# Patient Record
Sex: Male | Born: 1975 | Race: White | Hispanic: No | Marital: Married | Smoking: Current every day smoker
Health system: Southern US, Community
[De-identification: ages and names within clinical notes are randomized; demographics above are authoritative.]

## PROBLEM LIST (undated history)

## (undated) DIAGNOSIS — Z720 Tobacco use: Secondary | ICD-10-CM

## (undated) DIAGNOSIS — R55 Syncope and collapse: Secondary | ICD-10-CM

## (undated) DIAGNOSIS — I493 Ventricular premature depolarization: Secondary | ICD-10-CM

## (undated) DIAGNOSIS — R001 Bradycardia, unspecified: Secondary | ICD-10-CM

## (undated) DIAGNOSIS — I491 Atrial premature depolarization: Secondary | ICD-10-CM

## (undated) HISTORY — DX: Syncope and collapse: R55

## (undated) HISTORY — DX: Ventricular premature depolarization: I49.3

## (undated) HISTORY — DX: Bradycardia, unspecified: R00.1

## (undated) HISTORY — DX: Tobacco use: Z72.0

## (undated) HISTORY — DX: Atrial premature depolarization: I49.1

---

## 2007-04-26 ENCOUNTER — Emergency Department: Payer: Self-pay | Admitting: Emergency Medicine

## 2008-12-12 ENCOUNTER — Ambulatory Visit: Payer: Self-pay | Admitting: Internal Medicine

## 2009-03-31 ENCOUNTER — Emergency Department: Payer: Self-pay | Admitting: Emergency Medicine

## 2009-09-25 ENCOUNTER — Ambulatory Visit: Payer: Self-pay | Admitting: Family Medicine

## 2009-09-29 ENCOUNTER — Emergency Department: Payer: Self-pay | Admitting: Internal Medicine

## 2012-08-14 ENCOUNTER — Observation Stay: Payer: Self-pay | Admitting: Internal Medicine

## 2012-08-14 LAB — DRUG SCREEN, URINE

## 2012-08-14 LAB — URINALYSIS, COMPLETE
Bacteria: NONE SEEN
Bilirubin,UR: NEGATIVE
Blood: NEGATIVE
Glucose,UR: NEGATIVE mg/dL (ref 0–75)
Ketone: NEGATIVE
Leukocyte Esterase: NEGATIVE
Nitrite: NEGATIVE
Ph: 6 (ref 4.5–8.0)
Protein: NEGATIVE
RBC,UR: 1 /HPF (ref 0–5)
Specific Gravity: 1.018 (ref 1.003–1.030)
Squamous Epithelial: <1
WBC UR: 1 /HPF (ref 0–5)

## 2012-08-14 LAB — CK TOTAL AND CKMB (NOT AT ARMC)
CK, Total: 76 U/L (ref 35–232)
CK-MB: 0.9 ng/mL (ref 0.5–3.6)

## 2012-08-14 LAB — CBC
HCT: 45.5 % (ref 40.0–52.0)
MCH: 31.8 pg (ref 26.0–34.0)
MCHC: 35.3 g/dL (ref 32.0–36.0)
MCV: 90 fL (ref 80–100)
Platelet: 220 10*3/uL (ref 150–440)
RDW: 13.1 % (ref 11.5–14.5)

## 2012-08-14 LAB — BASIC METABOLIC PANEL
Anion Gap: 6 — ABNORMAL LOW (ref 7–16)
BUN: 12 mg/dL (ref 7–18)
Calcium, Total: 8.8 mg/dL (ref 8.5–10.1)
Chloride: 110 mmol/L — ABNORMAL HIGH (ref 98–107)
Co2: 23 mmol/L (ref 21–32)
Osmolality: 278 (ref 275–301)

## 2012-08-14 LAB — TROPONIN I: Troponin-I: <0.02 ng/mL

## 2012-08-15 DIAGNOSIS — I498 Other specified cardiac arrhythmias: Secondary | ICD-10-CM

## 2012-08-15 LAB — CK TOTAL AND CKMB (NOT AT ARMC)
CK, Total: 60 U/L (ref 35–232)
CK, Total: 51 U/L (ref 35–232)
CK-MB: 0.9 ng/mL (ref 0.5–3.6)
CK-MB: 0.7 ng/mL (ref 0.5–3.6)

## 2012-08-15 LAB — TROPONIN I
Troponin-I: <0.02 ng/mL
Troponin-I: <0.02 ng/mL

## 2012-08-23 ENCOUNTER — Encounter: Payer: Self-pay | Admitting: Cardiovascular Disease

## 2012-08-30 ENCOUNTER — Ambulatory Visit (INDEPENDENT_AMBULATORY_CARE_PROVIDER_SITE_OTHER): Payer: BC Managed Care – PPO | Admitting: Cardiovascular Disease

## 2012-08-30 ENCOUNTER — Encounter: Payer: Self-pay | Admitting: Cardiovascular Disease

## 2012-08-30 VITALS — BP 110/80 | HR 65 | Ht 76.0 in | Wt 232.0 lb

## 2012-08-30 DIAGNOSIS — I4949 Other premature depolarization: Secondary | ICD-10-CM

## 2012-08-30 DIAGNOSIS — R42 Dizziness and giddiness: Secondary | ICD-10-CM | POA: Insufficient documentation

## 2012-08-30 DIAGNOSIS — R079 Chest pain, unspecified: Secondary | ICD-10-CM

## 2012-08-30 DIAGNOSIS — I493 Ventricular premature depolarization: Secondary | ICD-10-CM | POA: Insufficient documentation

## 2012-08-30 MED ORDER — FLUDROCORTISONE ACETATE 0.1 MG PO TABS: 0.1000 mg | ORAL_TABLET | Freq: Every day | ORAL | Status: AC

## 2012-08-30 NOTE — Progress Notes (Signed)
   Patient ID: Micheal Sanchez, male    DOB: 08-Dec-1975, 36 y.o.   MRN: [id number]  HPI Comments: Mr. Freilich is a 36 year old gentleman who works at [employer]  with a long history of bradycardia, ectopy including PVCs and APCs, tobacco abuse who was admitted to the hospital 08/15/2012 for left-sided chest pain, tingling to the left arm. Cardiology was consult for bradycardia and arrhythmia.  Notes indicate that in July 2013 he had symptoms of bradycardia, chest pain. At [facility] he had echocardiogram and stress test that were normal. He was seen by [facility] and loop monitor was placed for 30 days showing recurrent PVCs. He was started on metoprolol.  On his recent admission, heart rates were down into the 40s. Blood pressure was adequate and stable. Cardiac enzymes negative. He continues to smoke one pack per day.  Symptoms consultation were various including chest discomfort, malaise, lightheadedness. Reports that he has had a long history of dizziness. Sometimes very short episodes, other times longer episodes for which she has to sit down. He attributes these episodes to either a low heart rate or blood pressure problems. He has not been taking it about her since his discharge and he has not noticed much difference. He does notice palpitations more without beta blocker but no improvement of his lightheadedness.  EKG today shows normal sinus rhythm with rate 65 beats per minute with no significant ST or T wave changes no ectopy   Outpatient Encounter Prescriptions as of 08/30/2012  Medication Sig Dispense Refill  . metoprolol tartrate (LOPRESSOR) 25 MG tablet Take 12.5 mg by mouth daily as needed.        Review of Systems  Constitutional: Negative.   HENT: Negative.   Eyes: Negative.   Respiratory: Negative.   Cardiovascular: Positive for chest pain.  Gastrointestinal: Negative.   Musculoskeletal: Negative.   Skin: Negative.   Neurological: Positive for light-headedness.  Hematological:  Negative.   Psychiatric/Behavioral: Negative.   All other systems reviewed and are negative.    BP 110/80  Pulse 65  Ht 6\' 4"  (1.93 m)  Wt 232 lb (105.235 kg)  BMI 28.24 kg/m2  Physical Exam  Nursing note and vitals reviewed. Constitutional: He is oriented to person, place, and time. He appears well-developed and well-nourished.  HENT:  Head: Normocephalic.  Nose: Nose normal.  Mouth/Throat: Oropharynx is clear and moist.  Eyes: Conjunctivae normal are normal. Pupils are equal, round, and reactive to light.  Neck: Normal range of motion. Neck supple. No JVD present.  Cardiovascular: Normal rate, regular rhythm, S1 normal, S2 normal, normal heart sounds and intact distal pulses.  Exam reveals no gallop and no friction rub.   No murmur heard. Pulmonary/Chest: Effort normal and breath sounds normal. No respiratory distress. He has no wheezes. He has no rales. He exhibits no tenderness.  Abdominal: Soft. Bowel sounds are normal. He exhibits no distension. There is no tenderness.  Musculoskeletal: Normal range of motion. He exhibits no edema and no tenderness.  Lymphadenopathy:    He has no cervical adenopathy.  Neurological: He is alert and oriented to person, place, and time. Coordination normal.  Skin: Skin is warm and dry. No rash noted. No erythema.  Psychiatric: He has a normal mood and affect. His behavior is normal. Judgment and thought content normal.           Assessment and Plan

## 2012-08-30 NOTE — Assessment & Plan Note (Signed)
Atypical chest pain. Prior stress test. Additional workup could include chest CT scan, possibly cardiac CT.

## 2012-08-30 NOTE — Assessment & Plan Note (Signed)
Etiology of his lightheadedness is unclear. Blood pressure is borderline low at times. Uncertain if his lightheadedness is from periodic bradycardia as he has had previously. We'll try to obtain the previous loop monitor. At that time beta blockers were prescribed. He seems to be relatively asymptomatic from his PVCs and more symptomatic from lightheadedness and chest pain. The metoprolol was recently held and heart rate is currently improved.  He does have a possible if he is having drops in his blood pressure, possibly associated with bradycardia. Most of his symptoms seem to be when he is walking at work or upright. Rare episodes when sitting. We have suggested we try to tackle one problem at a time. Rate seems improved without metoprolol that he continues to have symptoms. We will try to work on his blood pressure and start Florinef 0.1 mg daily with close monitoring of his blood pressure. If symptoms improve, this would argue that he is has labile blood pressure or vasovagal episodes. If no improvement, we will stop the Florinef.

## 2012-08-30 NOTE — Assessment & Plan Note (Signed)
Relatively asymptomatic from his PVCs apart from palpitations. Given bradycardia, we will hold the metoprolol and suggest he take this as needed.

## 2012-08-30 NOTE — Patient Instructions (Addendum)
  Please start florinef one pill a day for dizziness Monitor your blood pressure and call the office if it runs high on a regular basis (we would decrease the dose of the pill)  Please call us if you have new issues that need to be addressed before your next appt.  Your physician wants you to follow-up in: 1 month.

## 2012-09-07 ENCOUNTER — Telehealth: Payer: Self-pay

## 2012-09-07 NOTE — Telephone Encounter (Signed)
Pt called to report how he is feeling on new med/Florinef since Dr. Mariah Milling prescribed last week.  He wants [place] to know BP has been good 120/80 but he still has episodes of dizziness/lightheadedness. Says today he has not been able to go to work d/t his symptoms.  He is unsure as to what his BP is at this moment.  He denies any other associated symptoms such as CP, SOB.  I explained I would inform Dr. Mariah Milling of this and will call pt back with his response. Understanding verb.

## 2012-09-10 NOTE — Telephone Encounter (Signed)
I suspect his symptoms may not be cardiac. We can try to obtain his 30 day monitor from [facility] to verify their findings or he can repeat a 30 day monitor with [place] if still having sx.   He may need neurology eval if not cardiac. Would not miss work. Needs PMD to discuss symptoms.

## 2012-09-11 ENCOUNTER — Other Ambulatory Visit: Payer: Self-pay

## 2012-09-11 DIAGNOSIS — R42 Dizziness and giddiness: Secondary | ICD-10-CM

## 2012-09-11 NOTE — Telephone Encounter (Signed)
Event monitor ordered via e cardio

## 2012-09-11 NOTE — Telephone Encounter (Signed)
I called to make appt for neuro at [facility] with Dr. Malvin Johns. The receptionist tells me pt needs to contact financial counseling office at [phone] and pt can schedule own appt at that time. Pt was informed Understanding verb.

## 2012-09-11 NOTE — Telephone Encounter (Signed)
Pt informed Says he is still feeling poorly, having some dizziness at work Says BP at times is 150/99 while HR=70-76 BPM. He says other times, BP is normal and HR is "low" I explained Dr. Mariah Milling does not feel his symptoms are cardiac related but offered another 30 day monitor (per Dr. Mariah Milling) and a neuro referral Pt wants both of these. I told him I would go ahead and order monitor and refer to neuro I will call him back at [phone], [phone]

## 2012-09-13 DIAGNOSIS — R42 Dizziness and giddiness: Secondary | ICD-10-CM

## 2012-10-04 ENCOUNTER — Encounter: Payer: Self-pay | Admitting: Cardiovascular Disease

## 2012-10-04 ENCOUNTER — Ambulatory Visit (INDEPENDENT_AMBULATORY_CARE_PROVIDER_SITE_OTHER): Payer: BC Managed Care – PPO | Admitting: Cardiovascular Disease

## 2012-10-04 VITALS — BP 122/72 | HR 56 | Ht 76.0 in | Wt 232.8 lb

## 2012-10-04 DIAGNOSIS — I493 Ventricular premature depolarization: Secondary | ICD-10-CM

## 2012-10-04 DIAGNOSIS — R42 Dizziness and giddiness: Secondary | ICD-10-CM

## 2012-10-04 DIAGNOSIS — R079 Chest pain, unspecified: Secondary | ICD-10-CM

## 2012-10-04 DIAGNOSIS — R001 Bradycardia, unspecified: Secondary | ICD-10-CM

## 2012-10-04 DIAGNOSIS — R0602 Shortness of breath: Secondary | ICD-10-CM

## 2012-10-04 DIAGNOSIS — I4949 Other premature depolarization: Secondary | ICD-10-CM

## 2012-10-04 DIAGNOSIS — I498 Other specified cardiac arrhythmias: Secondary | ICD-10-CM

## 2012-10-04 NOTE — Assessment & Plan Note (Signed)
Chest pain is atypical in nature. Recent echocardiogram and stress test at [facility] showing no ischemia.

## 2012-10-04 NOTE — Assessment & Plan Note (Signed)
It would seem that he has asymptomatic bradycardia. Heart rate typically runs in the high 40s to 60s on a regular basis. We held the metoprolol as to not exaggerate any bradycardia and this was started at [facility] for PVCs but he is relatively asymptomatic from his ectopy.

## 2012-10-04 NOTE — Assessment & Plan Note (Addendum)
Etiology of his lightheadedness episodes is still uncertain. He continues to have these episodes despite a trial of Florinef. No significant events on monitor that would contribute to his lightheadedness. He has not marked events in the past 30 days to track his dizziness to underlying arrhythmia. No further cardiac testing at this time. We did discuss possibly having him evaluated by neurology.

## 2012-10-04 NOTE — Patient Instructions (Addendum)
You are doing well. No medication changes were made.  Please call us if you have new issues that need to be addressed before your next appt.    

## 2012-10-04 NOTE — Assessment & Plan Note (Signed)
Frequent PVCs on 30 day monitor. Asymptomatic in general. We did discuss antiarrhythmics though as he is asymptomatic, these will be started.

## 2012-10-04 NOTE — Progress Notes (Signed)
Patient ID: Micheal Sanchez, male    DOB: [birth year], 37 y.o.   MRN: [id number]  HPI Comments: Micheal Sanchez is a 37 year old gentleman who works at [employer]  with a long history of bradycardia, ectopy including PVCs and APCs, tobacco abuse who was admitted to the hospital 08/15/2012 for left-sided chest pain, tingling to the left arm. Cardiology was consult for bradycardia and arrhythmia.  Notes indicate that in July 2013 he had symptoms of bradycardia, chest pain. At [facility] he had echocardiogram and stress test that were normal. He was seen by [facility] and loop monitor was placed for 30 days showing recurrent PVCs. He was started on metoprolol.  On his recent admission, heart rates were down into the 40s. Blood pressure was adequate and stable. Cardiac enzymes negative. He continues to smoke one pack per day.  On his last clinic visit, blood pressure was borderline low and there was concern of orthostatic hypotension causing his dizziness. He was started on Florinef with no improvement of his dizziness. Blood pressure seemed to climb and he stopped the medication when he measured systolic pressures in the 160 range. Again no improvement in his dizziness.   He is almost done wearing his 30 day monitor. Review of the details so far show periods of bradycardia with rates in the 50s, rarely 40s, frequent PVCs. No other arrhythmia noted. He reports having tachycardia using an application on his smart phone but this is not correlated on the 30 day monitor.   He also reports having headaches, periods of abdominal cramping, periods of lightheadedness, dizziness, general malaise, fatigue, chest pain  EKG today shows normal sinus rhythm with rate 56 beats per minute with PVCs noted   Outpatient Encounter Prescriptions as of 10/04/2012  Medication Sig Dispense Refill  . metoprolol tartrate (LOPRESSOR) 25 MG tablet Take 12.5 mg by mouth daily as needed.         Review of Systems  Constitutional: Negative.    HENT: Negative.   Eyes: Negative.   Respiratory: Positive for shortness of breath.   Cardiovascular: Positive for chest pain and palpitations.  Gastrointestinal: Negative.   Musculoskeletal: Negative.   Skin: Negative.   Neurological: Positive for dizziness.  Hematological: Negative.   Psychiatric/Behavioral: Negative.   All other systems reviewed and are negative.    BP 122/72  Pulse 56  Ht 6\' 4"  (1.93 m)  Wt 232 lb 12 oz (105.575 kg)  BMI 28.33 kg/m2  Physical Exam  Nursing note and vitals reviewed. Constitutional: He is oriented to person, place, and time. He appears well-developed and well-nourished.  HENT:  Head: Normocephalic.  Nose: Nose normal.  Mouth/Throat: Oropharynx is clear and moist.  Eyes: Conjunctivae normal are normal. Pupils are equal, round, and reactive to light.  Neck: Normal range of motion. Neck supple. No JVD present.  Cardiovascular: Normal rate, regular rhythm, S1 normal, S2 normal, normal heart sounds and intact distal pulses.  Exam reveals no gallop and no friction rub.   No murmur heard. Pulmonary/Chest: Effort normal and breath sounds normal. No respiratory distress. He has no wheezes. He has no rales. He exhibits no tenderness.  Abdominal: Soft. Bowel sounds are normal. He exhibits no distension. There is no tenderness.  Musculoskeletal: Normal range of motion. He exhibits no edema and no tenderness.  Lymphadenopathy:    He has no cervical adenopathy.  Neurological: He is alert and oriented to person, place, and time. Coordination normal.  Skin: Skin is warm and dry. No rash noted. No  erythema.  Psychiatric: He has a normal mood and affect. His behavior is normal. Judgment and thought content normal.           Assessment and Plan

## 2012-10-19 ENCOUNTER — Other Ambulatory Visit: Payer: Self-pay | Admitting: *Deleted

## 2012-10-19 ENCOUNTER — Other Ambulatory Visit: Payer: Self-pay

## 2012-10-19 DIAGNOSIS — R42 Dizziness and giddiness: Secondary | ICD-10-CM

## 2012-10-19 DIAGNOSIS — I493 Ventricular premature depolarization: Secondary | ICD-10-CM

## 2012-11-06 ENCOUNTER — Observation Stay: Payer: Self-pay | Admitting: Internal Medicine

## 2012-11-06 LAB — COMPREHENSIVE METABOLIC PANEL
Albumin: 3.9 g/dL (ref 3.4–5.0)
Alkaline Phosphatase: 105 U/L (ref 50–136)
Anion Gap: 4 — ABNORMAL LOW (ref 7–16)
BUN: 12 mg/dL (ref 7–18)
Bilirubin,Total: 0.5 mg/dL (ref 0.2–1.0)
Calcium, Total: 9 mg/dL (ref 8.5–10.1)
Chloride: 108 mmol/L — ABNORMAL HIGH (ref 98–107)
Co2: 29 mmol/L (ref 21–32)
Creatinine: 1.04 mg/dL (ref 0.60–1.30)
Potassium: 3.9 mmol/L (ref 3.5–5.1)
SGOT(AST): 21 U/L (ref 15–37)
Sodium: 141 mmol/L (ref 136–145)
Total Protein: 7.1 g/dL (ref 6.4–8.2)

## 2012-11-06 LAB — URINALYSIS, COMPLETE
Bilirubin,UR: NEGATIVE
Blood: NEGATIVE
Leukocyte Esterase: NEGATIVE
Nitrite: NEGATIVE
Ph: 6 (ref 4.5–8.0)
Protein: NEGATIVE
RBC,UR: <1 /HPF (ref 0–5)
Specific Gravity: 1.02 (ref 1.003–1.030)
Squamous Epithelial: <1
WBC UR: 4 /HPF (ref 0–5)

## 2012-11-06 LAB — CBC
HCT: 46.2 % (ref 40.0–52.0)
HGB: 15.8 g/dL (ref 13.0–18.0)
MCH: 30.9 pg (ref 26.0–34.0)
Platelet: 236 10*3/uL (ref 150–440)
RBC: 5.11 10*6/uL (ref 4.40–5.90)
RDW: 13.4 % (ref 11.5–14.5)

## 2012-11-06 LAB — LIPASE, BLOOD: Lipase: 74 U/L (ref 73–393)

## 2012-11-07 LAB — CBC WITH DIFFERENTIAL/PLATELET
Eosinophil #: 0.4 10*3/uL (ref 0.0–0.7)
Eosinophil %: 6.6 %
HCT: 42.3 % (ref 40.0–52.0)
Lymphocyte #: 2.8 10*3/uL (ref 1.0–3.6)
MCH: 31.2 pg (ref 26.0–34.0)
MCHC: 34.7 g/dL (ref 32.0–36.0)
MCV: 90 fL (ref 80–100)
Monocyte #: 0.6 x10 3/mm (ref 0.2–1.0)
Platelet: 190 10*3/uL (ref 150–440)
RBC: 4.69 10*6/uL (ref 4.40–5.90)

## 2012-12-29 ENCOUNTER — Other Ambulatory Visit: Payer: Self-pay | Admitting: Unknown Physician Specialty

## 2012-12-29 ENCOUNTER — Ambulatory Visit: Payer: Self-pay | Admitting: Unknown Physician Specialty

## 2012-12-31 LAB — WBCS, STOOL

## 2013-01-01 ENCOUNTER — Emergency Department: Payer: Self-pay | Admitting: Emergency Medicine

## 2013-01-01 LAB — COMPREHENSIVE METABOLIC PANEL
Anion Gap: 9 (ref 7–16)
Bilirubin,Total: 0.4 mg/dL (ref 0.2–1.0)
Chloride: 108 mmol/L — ABNORMAL HIGH (ref 98–107)
EGFR (African American): >60
EGFR (Non-African Amer.): >60
Glucose: 96 mg/dL (ref 65–99)
Osmolality: 276 (ref 275–301)
SGOT(AST): 25 U/L (ref 15–37)
Sodium: 139 mmol/L (ref 136–145)
Total Protein: 7.4 g/dL (ref 6.4–8.2)

## 2013-01-01 LAB — URINALYSIS, COMPLETE
Bacteria: NONE SEEN
Bilirubin,UR: NEGATIVE
Blood: NEGATIVE
Glucose,UR: NEGATIVE mg/dL (ref 0–75)
Leukocyte Esterase: NEGATIVE
Nitrite: NEGATIVE
Ph: 5 (ref 4.5–8.0)
RBC,UR: 1 /HPF (ref 0–5)
Specific Gravity: 1.03 (ref 1.003–1.030)
Squamous Epithelial: <1
WBC UR: <1 /HPF (ref 0–5)

## 2013-01-01 LAB — CBC
HGB: 16.6 g/dL (ref 13.0–18.0)
MCHC: 34.4 g/dL (ref 32.0–36.0)
MCV: 89 fL (ref 80–100)
Platelet: 253 10*3/uL (ref 150–440)
WBC: 8.1 10*3/uL (ref 3.8–10.6)

## 2013-01-03 ENCOUNTER — Ambulatory Visit: Payer: Self-pay | Admitting: Unknown Physician Specialty

## 2013-01-05 LAB — PATHOLOGY REPORT

## 2013-01-17 ENCOUNTER — Other Ambulatory Visit: Payer: Self-pay | Admitting: Unknown Physician Specialty

## 2013-01-17 DIAGNOSIS — R1032 Left lower quadrant pain: Secondary | ICD-10-CM

## 2013-01-18 ENCOUNTER — Ambulatory Visit
Admission: RE | Admit: 2013-01-18 | Discharge: 2013-01-18 | Disposition: A | Discharge status: Home / Self Care | Payer: BC Managed Care – PPO | Source: Ambulatory Visit | Attending: Unknown Physician Specialty | Admitting: Unknown Physician Specialty

## 2013-01-18 DIAGNOSIS — R1032 Left lower quadrant pain: Secondary | ICD-10-CM

## 2013-01-18 MED ORDER — IOHEXOL 300 MG/ML  SOLN
125.0000 mL | Freq: Once | INTRAMUSCULAR | Status: AC | PRN
  Administered 2013-01-18: 125 mL via INTRAVENOUS

## 2013-07-26 ENCOUNTER — Emergency Department: Payer: Self-pay | Admitting: Emergency Medicine

## 2013-07-26 LAB — COMPREHENSIVE METABOLIC PANEL
Albumin: 4 g/dL (ref 3.4–5.0)
Alkaline Phosphatase: 100 U/L (ref 50–136)
BUN: 9 mg/dL (ref 7–18)
Bilirubin,Total: 0.6 mg/dL (ref 0.2–1.0)
Calcium, Total: 9 mg/dL (ref 8.5–10.1)
Co2: 23 mmol/L (ref 21–32)
Creatinine: 1.22 mg/dL (ref 0.60–1.30)
EGFR (African American): >60
EGFR (Non-African Amer.): >60
Glucose: 109 mg/dL — ABNORMAL HIGH (ref 65–99)
Total Protein: 7.2 g/dL (ref 6.4–8.2)

## 2013-07-26 LAB — URINALYSIS, COMPLETE
Bilirubin,UR: NEGATIVE
Blood: NEGATIVE
Ketone: NEGATIVE
Nitrite: NEGATIVE
Ph: 7 (ref 4.5–8.0)
Protein: 30
RBC,UR: 1 /HPF (ref 0–5)
Specific Gravity: 1.027 (ref 1.003–1.030)
Squamous Epithelial: NONE SEEN

## 2013-07-26 LAB — CBC
HGB: 17.2 g/dL (ref 13.0–18.0)
MCHC: 35.1 g/dL (ref 32.0–36.0)
RDW: 13.2 % (ref 11.5–14.5)

## 2013-08-06 ENCOUNTER — Emergency Department: Payer: Self-pay | Admitting: Emergency Medicine

## 2014-10-16 IMAGING — CT CT ABD-PELV W/ CM
1 of 2 series · 15 of 32 positions shown, 19 images · non-contrast
Comparison: none

REASON FOR EXAM: Abd Pain Diarrhea
COMMENTS:

[Series 2: abd 3mm w 3.0 i40f 3 · axial · 0.79mm/px · z∈[-1010,-568]mm · 15 of 161 slices shown, 19 images]
[im 7/161  soft-tissue]
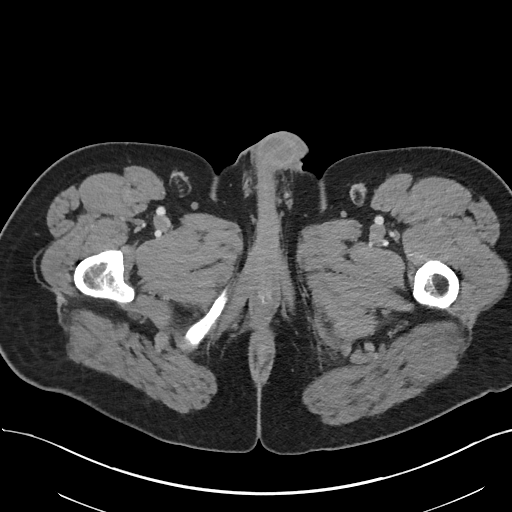
[im 7/161  bone]
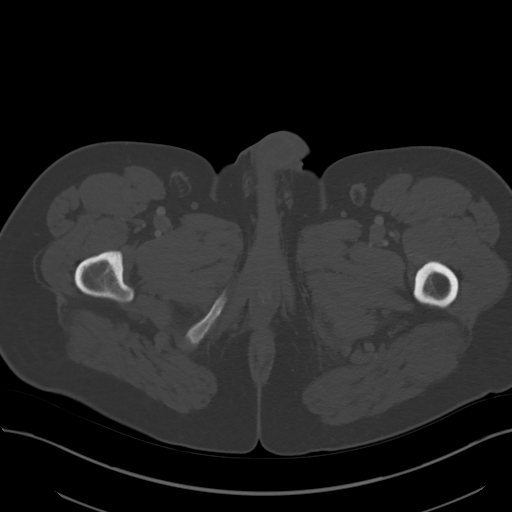
[im 21/161  soft-tissue]
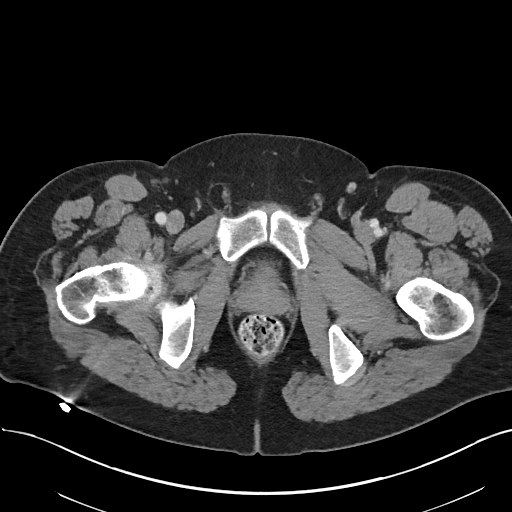
[im 34/161  soft-tissue]
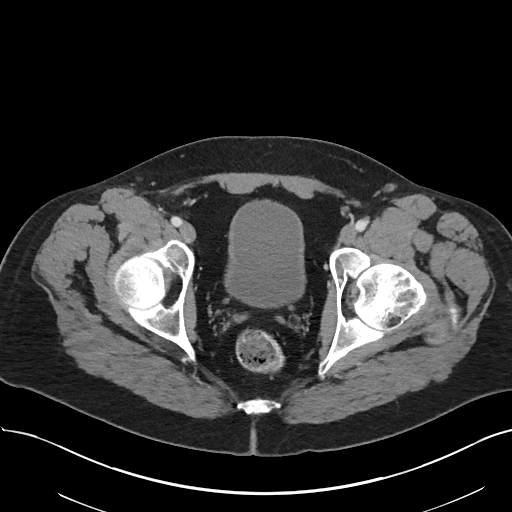
[im 47/161  soft-tissue]
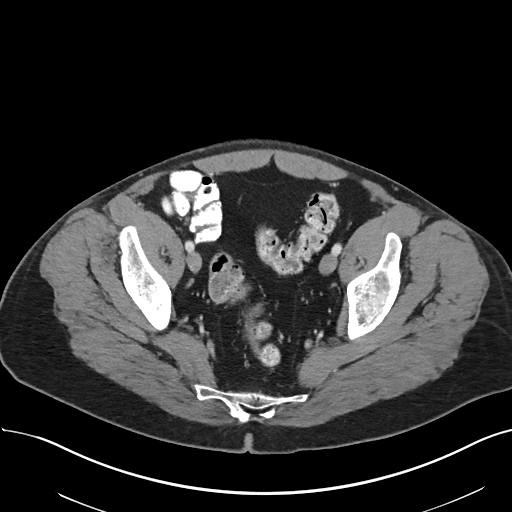
[im 54/161  soft-tissue]
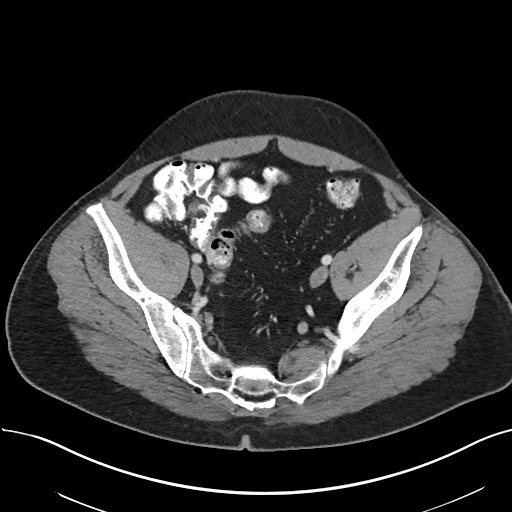
[im 67/161  soft-tissue]
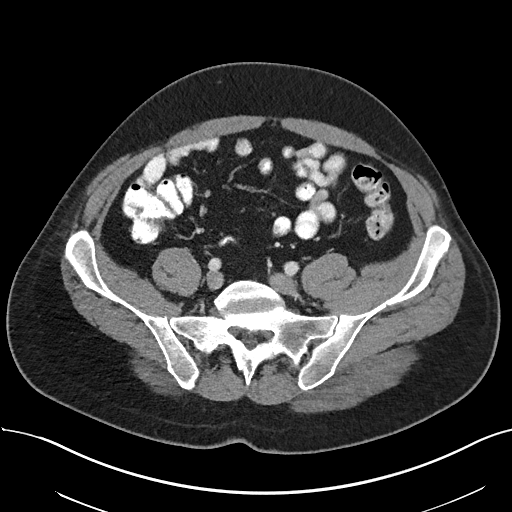
[im 81/161  soft-tissue]
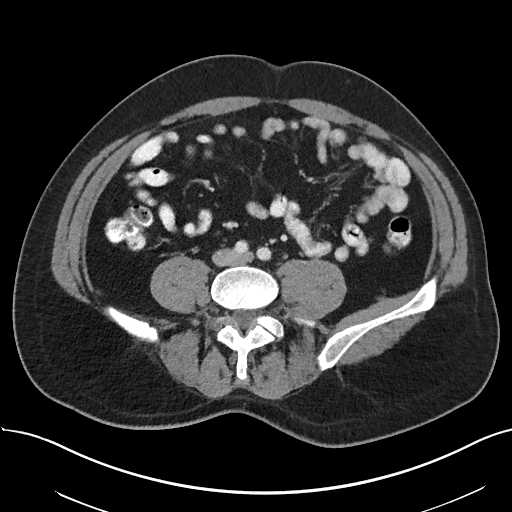
[im 94/161  soft-tissue]
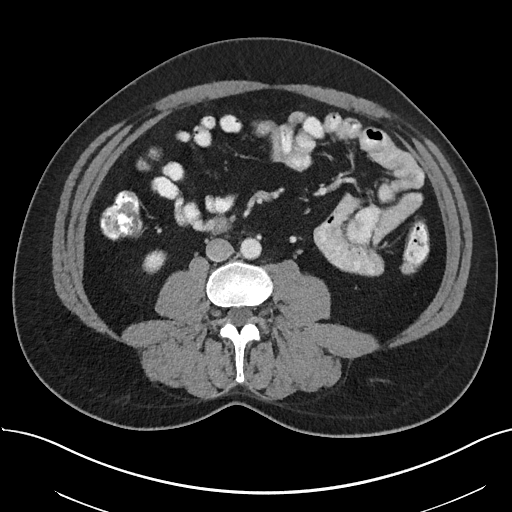
[im 107/161  soft-tissue]
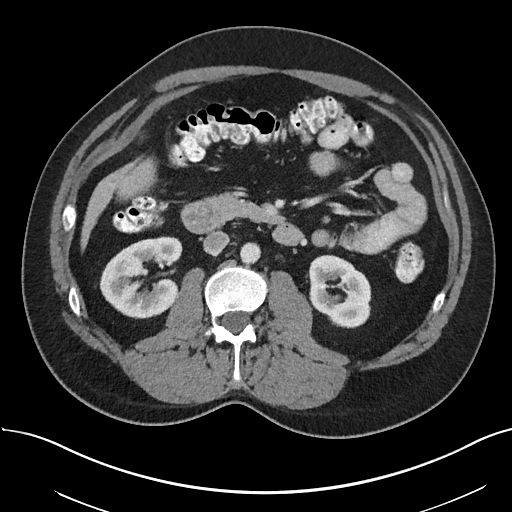
[im 107/161  bone]
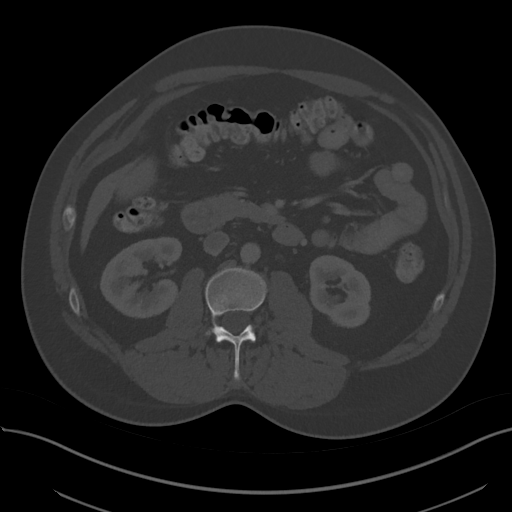
[im 114/161  soft-tissue]
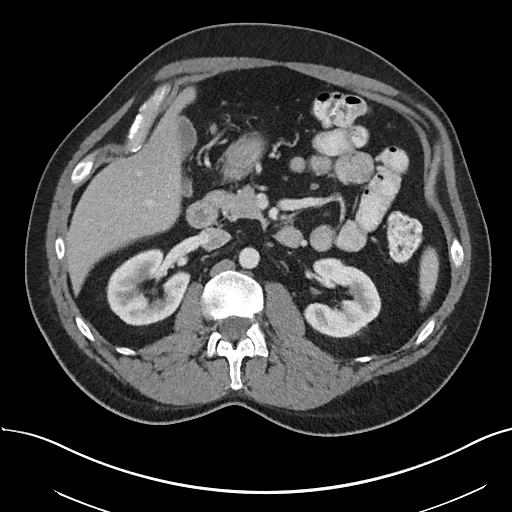
[im 127/161  soft-tissue]
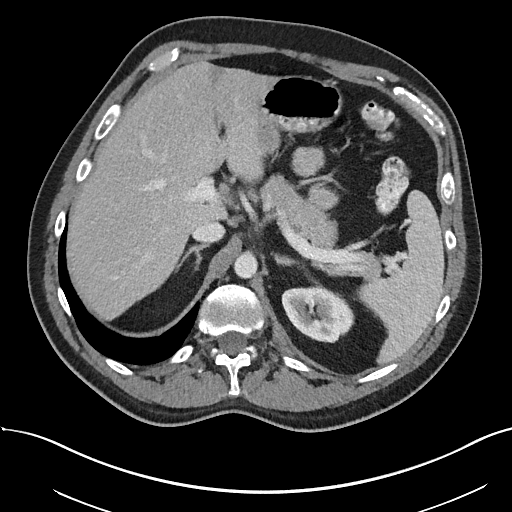
[im 134/161  lung]
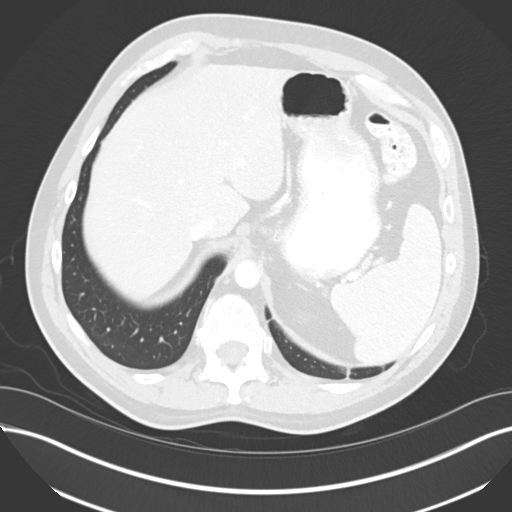
[im 141/161  soft-tissue]
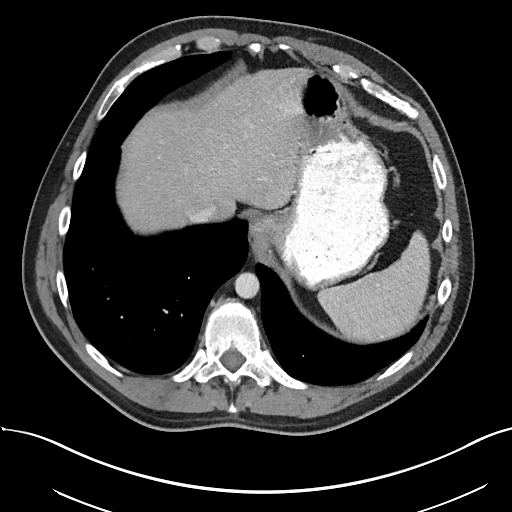
[im 141/161  lung]
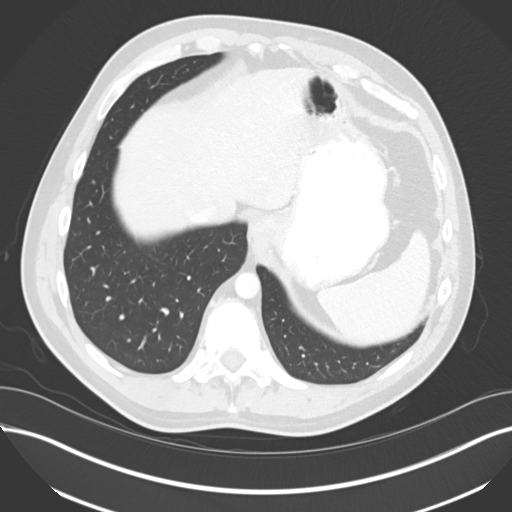
[im 147/161  lung]
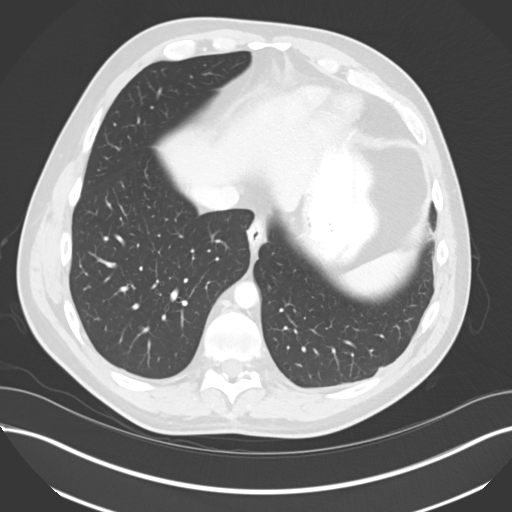
[im 154/161  soft-tissue]
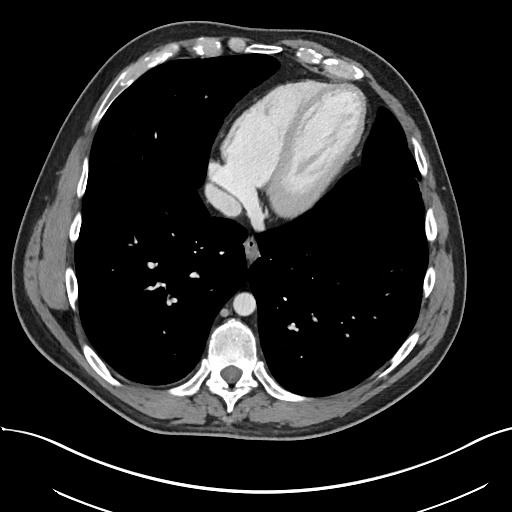
[im 154/161  lung]
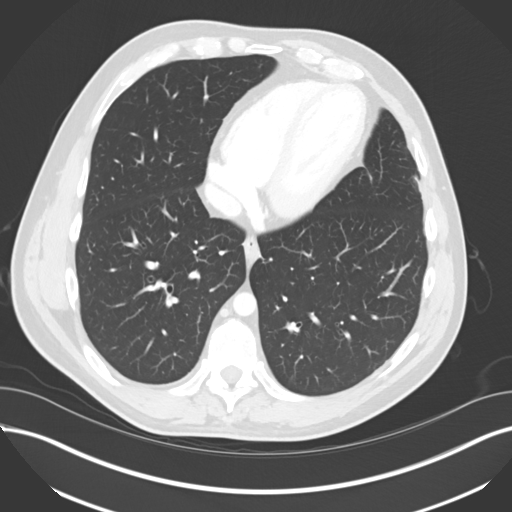

[15 of 32 positions shown; findings below may reference images not displayed]

PROCEDURE:     KCT - KCT ABDOMEN/PELVIS W  - December 29, 2012  [DATE]

RESULT:     Axial CT scanning was performed through the abdomen and pelvis
with reconstructions at 3 mm intervals and slice thicknesses. The patient
received 100 cc of [id number] as well as oral contrast material.

The stomach is moderately distended with the orally administered contrast.
There is subjective thickening of the wall of the pyloric region of the
stomach. Contrast has traversed the pylorus and reached as far distally as
the rectosigmoid but the volume of contrast in the upper abdomen is fairly
limited. The duodenum is normal in caliber. I do not see perigastric
inflammatory changes or lymphadenopathy.

The liver exhibits no focal mass or ductal dilation. The gallbladder is
adequately distended. The pancreas, spleen, adrenal glands, and kidneys are
normal in appearance. The caliber of the abdominal aorta is normal.

The partially contrast-filled loops of small and large bowel exhibit no
evidence of ileus or obstruction. A normal calibered uninflamed appendix is
demonstrated. The urinary bladder, prostate gland, and seminal vesicles are
normal in appearance.

The lumbar vertebral bodies are preserved in height. The lung bases are
clear.
IMPRESSION: 1. There is subjective thickening of the wall of the pyloric region of the
stomach. The stomach is moderately distended with contrast. Direct
visualization of the stomach and proximal duodenum would be useful.
2. There is no evidence of acute abnormality of the liver or gallbladder or
pancreas. There is no splenomegaly.
3. There is no intra-abdominal or pelvic lymphadenopathy.
4. There is no acute abnormality of the urinary tracts nor of the small or
large bowel.

[REDACTED]

## 2015-01-14 NOTE — H&P (Signed)
PATIENT NAME:  Micheal Sanchez, Micheal Sanchez MR#:  [id number] DATE OF BIRTH:  11-27-1975  DATE OF ADMISSION:  08/14/2012  PRIMARY CARDIOLOGIST: [facility]    CHIEF COMPLAINT: Chest tightness and lightheadedness.   HISTORY OF PRESENT ILLNESS: This is a 39 year old Caucasian male patient with history of bradycardia and tobacco abuse on metoprolol who presents to the Emergency Room complaining of left-sided chest tightness radiating along with tingling in his left arm. No aggravating or relieving factors. This is not exertional. No associated PND, orthopnea, shortness of breath. The patient has had on and off lightheadedness to the point that he almost blacks out and feeling extremely weak. The patient in July of 2013 had similar chest pain along with bradycardia who [facility] where he had an echo and stress test which were normal. He was on a loop monitor for 30 days, was diagnosed with recurrent PVCs and was placed on metoprolol. He does have good and bad days. Today in the Emergency Room the patient was found to be bradycardic in the 40's. While I was in the room talking to the patient, he did go as low as 38 per minute and the patient is being admitted for sinus bradycardia with PVCs associated with chest pain.   The patient does not have hypertension or diabetes. He does smoke a pack a day of cigarettes. No premature coronary artery disease in the family. Cardiac enzymes here in the ER are normal. EKG shows occasional PVCs. No acute ST-T wave changes.   PAST MEDICAL HISTORY:  1. Bradycardia. 2. Tobacco abuse.    FAMILY HISTORY: History of heart attack in his uncle and grandfather. No premature coronary artery disease.   SOCIAL HISTORY: The patient smokes a pack a day. Occasional alcohol. No illicit drugs. He works for [employer].  REVIEW OF SYSTEMS: CONSTITUTIONAL: Complains of fatigue and weakness. No weight loss, weight gain. EYES: No blurred vision, discharge, or pain. ENT: No  tinnitus, hearing loss, or sinusitis. CARDIOVASCULAR: Complains of chest pain radiating to the left arm and has bradycardia. No PND or orthopnea. RESPIRATORY: No shortness of breath, wheezing, hemoptysis. GI: No nausea, vomiting, diarrhea, melena. GENITOURINARY: No dysuria, frequency, hematuria. SKIN: No rash, ulcers. MUSCULOSKELETAL: No arthritis, gout. NEUROLOGIC: No focal numbness, weakness, dysarthria. ENDOCRINE: No thyroid problems or diabetes. No polyuria or polydipsia. HEMATOLOGIC: No easy bruising, bleeding, or anemia.   HOME MEDICATIONS: Metoprolol 25 mg half a tablet oral twice a day.   ALLERGIES: No known drug allergies.   PHYSICAL EXAMINATION:   VITAL SIGNS: Temperature 98.1, pulse ranging between 38 to 52, respirations 20, blood pressure 118/71, saturating 97% on room air.   GENERAL: Moderately built Caucasian male patient lying in bed comfortable, conversational, cooperative with exam.   PSYCHIATRIC: Alert, oriented x3. Mood and affect appropriate. Judgment intact.   HEENT: Atraumatic, normocephalic. Oral mucosa moist and pink. External ears and nose normal. No pallor. No icterus. Pupils bilaterally equal and reactive to light.   NECK: Supple. No thyromegaly. No palpable lymph nodes. Trachea midline. No carotid bruit or JVD.   CARDIOVASCULAR: S1 and S2 bradycardia without any murmurs. Peripheral pulses 2+. No edema.   RESPIRATORY: Normal work of breathing. Clear to auscultation on both sides. Normal to percussion.   GI: Soft abdomen, nontender. Bowel sounds present. No hepatosplenomegaly palpable.   SKIN: Warm and dry. No petechiae, rash, ulcers.   MUSCULOSKELETAL: No joint swelling, redness, effusion of the large joints. Normal muscle tone.   NEUROLOGICAL: Motor strength 5/5 in  upper and lower extremities. Sensation to fine touch intact all over.   LYMPHATIC: No cervical lymphadenopathy.   LABORATORY, DIAGNOSTIC AND RADIOLOGIC DATA: Glucose 105, BUN 12, creatinine 0.84,  sodium 134, potassium 4.1, GFR greater than 60. TSH 0.714. Troponin less than 0.02. CK 76. MB 0.9. Urine drug screen negative. WBC 7.1, hemoglobin 16. Urinalysis shows no WBC or bacteria.   EKG shows normal sinus rhythm with occasional PVCs, bradycardia to 48.   ASSESSMENT AND PLAN:  1. Sinus bradycardia likely secondary from the metoprolol the patient is on. The patient has symptomatic bradycardia with presyncopal episodes and feeling extremely fatigued. He did go down as low as 38 while I was in the room on the tele. Will admit the patient for monitoring. Stop the metoprolol. TSH is normal.  2. Atypical chest pain in a patient with no risk factors who is young and had a recent stress test which has been negative. Extremely unlikely to be acute coronary syndrome. Will check two more sets of cardiac enzymes. Will consult Cardiology for input with the bradycardia. Also, the patient requests to be established with a local cardiologist in town.  3. Tobacco abuse. I have counseled the patient for more than three minutes to quit smoking.  4. DVT prophylaxis with ambulation.   CODE STATUS: FULL CODE.   TIME SPENT: Time spent today on this case was 45 minutes with more than 50% time spent in coordination of care.    ____________________________ Molinda Bailiff. Jadore Mcguffin, MD srs:drc D: 08/14/2012 18:44:39 ET T: 08/15/2012 05:41:32 ET JOB#: [id number]  cc: Wardell Heath R. Elpidio Anis, MD, <Dictator> Antonieta Iba, MD Orie Fisherman MD ELECTRONICALLY SIGNED 08/15/2012 16:51

## 2015-01-14 NOTE — Discharge Summary (Signed)
PATIENT NAME:  Micheal Sanchez, Micheal Sanchez MR#:  [id number] DATE OF BIRTH:  01/22/1976  DATE OF ADMISSION:  08/14/2012 DATE OF DISCHARGE:  08/15/2012  DISCHARGE DIAGNOSES:  1. Sinus bradycardia, likely from beta blocker, now stopped. Can be used as needed for palpitations only. 2. Chest pain, likely noncardiac.   SECONDARY DIAGNOSES:  1. Bradycardia. 2. Tobacco abuse.   CONSULTANTS: Julien Nordmannimothy Gollan, MD - Cardiology.  PROCEDURES/RADIOLOGY: Chest x-ray on 08/14/2012 showed no acute cardiopulmonary disease.   MAJOR LABORATORY PANEL: Urinalysis on admission was negative.   HISTORY AND SHORT HOSPITAL COURSE: The patient is a 39 year old male with known history of bradycardia who was admitted for sinus bradycardia thought to be secondary to beta blocker. He did not have much symptoms other than some weakness and possible presyncopal episode. Cardiology consultation was obtained with Dr. Mariah MillingGollan and Myoview was done and had negative three sets of cardiac enzymes, also had normal TSH and urine drug screen was also negative. As the patient was not symptomatic, cardiology recommended no further work-up and holding metoprolol. The patient did have some PVCs and APCs on telemetry, but he was asymptomatic. Dr. Mariah MillingGollan recommended discharge. The patient was in agreement and went home in stable condition.   DISCHARGE PHYSICAL EXAMINATION:   VITAL SIGNS: On the date of discharge, on 08/15/2012, his temperature was 97.5, heart rate 43 per minute, respirations 16 per minute, blood pressure 124/79 mmHg, and he was saturating 96% on room air.   CARDIOVASCULAR: S1 and S2 normal. No murmurs, rubs, or gallops.   LUNGS: Clear to auscultation bilaterally. No wheezing, rales, rhonchi, or crepitation.   ABDOMEN: Soft, benign.   NEUROLOGIC: Nonfocal examination.   All other physical examination remained at baseline.   DISCHARGE MEDICATIONS: Metoprolol 12.5 mg p.o. at bedtime as needed for palpitations only.  DISCHARGE DIET:  Regular.   DISCHARGE ACTIVITY: As tolerated.   DISCHARGE INSTRUCTIONS AND FOLLOW-UP: The patient was instructed to follow-up with [facility] or [facility] in 1 to 2 weeks.   TOTAL TIME DISCHARGING THIS PATIENT: 45 minutes. ____________________________ Ellamae SiaVipul S. Sherryll BurgerShah, MD vss:slb D: 08/19/2012 12:17:41 ET T: 08/20/2012 14:25:19 ET JOB#: [id number]  cc: Micheal Mendibles S. Sherryll BurgerShah, MD, <Dictator> Micheal Sanchez. Gollan, MD [facility] Ellamae SiaVIPUL S [facility] MD ELECTRONICALLY SIGNED 08/20/2012 14:39

## 2015-01-14 NOTE — Consult Note (Signed)
General Aspect 39 year old Caucasian male patient with history of bradycardia, tobacco abuse, on metoprolol for PVCs seen on event monitor, who presents to the Emergency Room complaining of left-sided chest tightness radiating along with tingling in his left arm. Cardiology was consulted for chest pain sx, bradycardia, arrhythmia.  Chest presented yesterday, while at rest. No aggravating or relieving factors. No associated PND, orthopnea, shortness of breath. he reports rare episodes of lightheadedness with feelings of extreme weakness. Sx can last all day sometimes and he does not go to work. The patient in July of 2013 had similar chest pain along with bradycardia who presented to [facility] where he had an echo and stress test which were normal. He was seen by [facility] and loop monitor for 30 days ordered, showing  recurrent PVCs and was placed on metoprolol.  In the Emergency Room the patient was found to be bradycardic in the 40???s, BP stable.  cardiac enz negative x 3  The patient does not have hypertension or diabetes.  Social:  He does smoke a pack a day of cigarettes.Libves with Wife.   Family Hx:  No premature coronary artery disease in the family.   Physical Exam:   GEN well developed, well nourished, no acute distress    HEENT red conjunctivae    NECK supple  No masses    RESP normal resp effort  clear BS    CARD Regular rate and rhythm  No murmur    ABD denies tenderness  soft    EXTR negative edema    SKIN normal to palpation    NEURO motor/sensory function intact    PSYCH alert, A+O to time, place, person, good insight   Review of Systems:   Subjective/Chief Complaint Chest pain    General: Fatigue    Skin: No Complaints    ENT: No Complaints    Eyes: No Complaints    Neck: No Complaints    Respiratory: No Complaints    Cardiovascular: No Complaints    Gastrointestinal: No Complaints    Genitourinary: No Complaints    Vascular: No  Complaints    Musculoskeletal: No Complaints    Neurologic: No Complaints    Hematologic: No Complaints    Endocrine: No Complaints    Psychiatric: No Complaints    Review of Systems: All other systems were reviewed and found to be negative    Medications/Allergies Reviewed Medications/Allergies reviewed        Admit Diagnosis:   CHEST PAIN: 15-Aug-2012, Active, CHEST PAIN  Home Medications: Medication Instructions Status  metoprolol tartrate 25 mg oral tablet 0.5 tab(s) orally 2 times a day Active   Lab Results:  Thyroid:  18-Nov-13 16:14    Thyroid Stimulating Hormone 0.714 (0.45-4.50 (International Unit)  ----------------------- Pregnant patients have  different reference  ranges for TSH:  - - - - - - - - - -  Pregnant, first trimetser:  0.36 - 2.50 uIU/mL)  Routine Chem:  18-Nov-13 16:14    Glucose, Serum  105   BUN 12   Creatinine (comp) 0.84   Sodium, Serum 139   Potassium, Serum 4.1   Chloride, Serum  110   CO2, Serum 23   Calcium (Total), Serum 8.8   Anion Gap  6   Osmolality (calc) 278   eGFR (African American) >60   eGFR (Non-African American) >60 (eGFR values <72m/min/1.73 m2 may be an indication of chronic kidney disease (CKD). Calculated eGFR is useful in patients with stable renal  function. The eGFR calculation will not be reliable in acutely ill patients when serum creatinine is changing rapidly. It is not useful in  patients on dialysis. The eGFR calculation may not be applicable to patients at the low and high extremes of body sizes, pregnant women, and vegetarians.)  Cardiac:  18-Nov-13 16:14    CK, Total 76   CPK-MB, Serum 0.9 (Result(s) reported on 14 Aug 2012 at 04:57PM.)   Troponin I < 0.02 (0.00-0.05 0.05 ng/mL or less: NEGATIVE  Repeat testing in 3-6 hrs  if clinically indicated. >0.05 ng/mL: POTENTIAL  MYOCARDIAL INJURY. Repeat  testing in 3-6 hrs if  clinically indicated. NOTE: An increase or decrease  of 30% or more on  serial  testing suggests a  clinically important change)  19-Nov-13 00:25    CK, Total 60   CPK-MB, Serum 0.9 (Result(s) reported on 15 Aug 2012 at 01:06AM.)   Troponin I < 0.02 (0.00-0.05 0.05 ng/mL or less: NEGATIVE  Repeat testing in 3-6 hrs  if clinically indicated. >0.05 ng/mL: POTENTIAL  MYOCARDIAL INJURY. Repeat  testing in 3-6 hrs if  clinically indicated. NOTE: An increase or decrease  of 30% or more on serial  testing suggests a  clinically important change)  Routine Coag:  18-Nov-13 16:14    Activated PTT (APTT) 29.8 (A HCT value >55% may artifactually increase the APTT. In one study, the increase was an average of 19%. Reference: "Effect on Routine and Special Coagulation Testing Values of Citrate Anticoagulant Adjustment in Patients with High HCT Values." American Journal of Clinical Pathology 2006;126:400-405.)   Prothrombin 13.9   INR 1.0 (INR reference interval applies to patients on anticoagulant therapy. A single INR therapeutic range for coumarins is not optimal for all indications; however, the suggested range for most indications is 2.0 - 3.0. Exceptions to the INR Reference Range may include: Prosthetic heart valves, acute myocardial infarction, prevention of myocardial infarction, and combinations of aspirin and anticoagulant. The need for a higher or lower target INR must be assessed individually. Reference: The Pharmacology and Management of the Vitamin K  antagonists: the seventh ACCP Conference on Antithrombotic and Thrombolytic Therapy. GUYQI.3474 Sept:126 (3suppl): [id number]. A HCT value >55% may artifactually increase the PT.  In one study,  the increase was an average of 25%. Reference:  "Effect on Routine and Special Coagulation Testing Values of Citrate Anticoagulant Adjustment in Patients with High HCT Values." American Journal of Clinical Pathology 2006;126:400-405.)  Routine Hem:  18-Nov-13 16:14    WBC (CBC) 7.1   RBC (CBC) 5.04    Hemoglobin (CBC) 16.0   Hematocrit (CBC) 45.5   Platelet Count (CBC) 220 (Result(s) reported on 14 Aug 2012 at 04:24PM.)   MCV 90   MCH 31.8   MCHC 35.3   RDW 13.1   EKG:   Interpretation EKG with sinus bradycardia, rate 54 bpm, rare PVCs    No Known Allergies:   Vital Signs/Nurse's Notes: **Vital Signs.:   19-Nov-13 15:59   Vital Signs Type Routine   Temperature Temperature (F) 97.5   Celsius 36.3   Temperature Source Oral   Pulse Pulse 43   Respirations Respirations 16   Systolic BP Systolic BP 259   Diastolic BP (mmHg) Diastolic BP (mmHg) 79   Mean BP 94   Pulse Ox % Pulse Ox % 96   Pulse Ox Activity Level  At rest   Oxygen Delivery Room Air/ 21 %     Impression 39 year old Caucasian male patient with history of bradycardia, tobacco  abuse, on metoprolol for PVCs seen on event monitor, who presents to the Emergency Room complaining of left-sided chest tightness radiating along with tingling in his left arm.    1) Bradycardia:  hemodynamically stable despite epsiodes of bradycardia. No further workup needed Previous EP work up at [facility] metoprolol given fatigue  2) PVCs, APCS on tele asymptomatic.  Hold metoprolol as this is not causing his chest pain and he has no sx of palpitations. If sx get worse and felt to be secondary to PVCs, could consider flecainide 25 mg po BID with slow titration upwards.  3) Chest pain: Negative cardiac enz x 3 previous stress test and echo at [facility] No further workup needed. Possible pericarditis, musculoskeletal, cervical spine? Consider a trial of NSAIDS  Ok to D/C with follow up   Electronic Signatures: Ida Rogue (MD)  (Signed [phone] 17:24)  Authored: General Aspect/Present Illness, History and Physical Exam, Review of System, Past Medical History, Health Issues, Home Medications, Labs, EKG , Allergies, Vital Signs/Nurse's Notes, Impression/Plan   Last Updated: 19-Nov-13 17:24 by Ida Rogue (MD)

## 2015-01-17 NOTE — Consult Note (Signed)
Pt CC hematemesis.  Pt EGD showed ulcerated esophagus on two folds at [facility].  Also antral mild gastritis and moderate inflammed duodenitis.  Will start clear liquids and if able to keep this and oral PPI  down could go home.  Needs to avoid NSAID and alcohol.  Would take PPI twice a day for a month then once a day for another 2-3 months.  Electronic Signatures: Scot JunElliott, Robert T (MD)  (Signed on 11-Feb-14 13:11)  Authored  Last Updated: 11-Feb-14 13:11 by Scot JunElliott, Robert T (MD)

## 2015-01-17 NOTE — Discharge Summary (Signed)
PATIENT NAME:  Micheal Sanchez, Micheal Sanchez MR#:  [id number] DATE OF BIRTH:  31-May-1976  DATE OF ADMISSION:  11/06/2012 DATE OF DISCHARGE:  11/07/2012  PRIMARY CARE PHYSICIAN:  [facility]   FINAL DIAGNOSES:   1.  Hematemesis and abdominal pain with esophageal ulcerations.  2.  Asymptomatic bradycardia.  3.  Tobacco abuse.   MEDICATIONS ON DISCHARGE:  Omeprazole 20 mg twice a day. Advised not to take any alcohol or NSAIDS including aspirin, ibuprofen, Motrin, Aleve or BC powder.   DIET:  Soft diet for 3 days then proceed to regular.   ACTIVITY:  As tolerated.   FOLLOWUP:  [facility] and follow up with Dr. Mechele CollinElliott in 4 weeks.   HOSPITAL COURSE:  The patient was admitted as an observation on November 06, 2012 and discharged on November 07, 2012. He came in with abdominal pain and coffee-ground vomiting.   This 39 year old man having midepigastric pain and vomiting coffee-ground material was admitted to the hospital as an observation, given IV Protonix and patient was scheduled for an upper endoscopy. Laboratory and radiological data during the hospital course included a lipase of 74, glucose 91, BUN 12, creatinine 1.04, sodium 141, potassium 3.9, chloride 108, CO2 is 29, calcium 9.0. Liver function tests normal. White blood cell count 6.8, H and H 15.8 and 46.2, platelet count 236. CT scan of the abdomen and pelvis showed no acute abnormality. Repeat hemoglobin was 14.7. Upper endoscopy showed grade C reflux, acute and erosive esophagitis, gastritis and duodenitis.   HOSPITAL COURSE PER PROBLEM LIST:  1.  For the patient's hematemesis, abdominal pain esophageal ulceration, the patient was initially started on IV Protonix. He had an upper endoscopy which showed esophageal ulcerations, gastritis and duodenitis. He was flipped over to omeprazole 20 mg b.i.d. for 1 month and will need to be on that for 2 more months after that. Advised no alcohol or NSAIDS. He can follow up with  Dr. Mechele CollinElliott in 4 weeks. Hemoglobin remained stable. The patient was hemodynamically stable during the hospitalization.  2.  The patient has asymptomatic bradycardia. Heart rate going down as low as 37 during the hospital course. He had been worked up as outpatient previously. The patient is asymptomatic with this.  3.  Tobacco abuse. Smoking cessation counseling done 3 minutes upon the hospitalization.   TIME SPENT ON DISCHARGE:  35 minutes.   ____________________________ Herschell Dimesichard Sanchez. Renae GlossWieting, MD rjw:si D: 11/08/2012 14:18:00 ET T: 11/08/2012 20:53:53 ET JOB#: [id number]  cc: Herschell Dimesichard Sanchez. Renae GlossWieting, MD, <Dictator> [facility] [place] Scot Junobert T. Elliott, MD  Salley ScarletICHARD Sanchez Genavive Kubicki MD ELECTRONICALLY SIGNED 11/15/2012 13:38

## 2015-01-17 NOTE — H&P (Signed)
PATIENT NAME:  Micheal Sanchez, Micheal Sanchez MR#:  [id number] DATE OF BIRTH:  March 09, 1976  DATE OF ADMISSION:  11/06/2012  PRIMARY CARE PHYSICIAN:  From [facility] in [place].  EMERGENCY ROOM PHYSICIAN:  Dr. Darnelle Catalan.  CHIEF COMPLAINT:  Abdominal pain and coffee-ground vomiting.  HISTORY OF PRESENT ILLNESS:  A 39 year old male patient having  abdominal pain in the midepigastric region, started 2 months ago. At that time, he had an episode of coffee-ground vomiting. The symptoms subsided and restarted again this weekend, on Saturday. The episode of epigastric pain, which usually comes and goes, is associated with some nausea and coffee-ground vomiting. The patient says that he vomited twice on Saturday and 1 episode on Sunday, and today morning he had another episode. The patient denies any problem with appetite. No diarrhea. No fever. Complains of burning sensation in the stomach.  Says that he takes ibuprofen occasionally, and denies any alcohol abuse, but has a history of smoking; smokes about 1/2 pack per day. The patient was seen in [facility] in [facility] and was sent here for the same. The patient says that his pain is mainly in the epigastric region, not radiating to the back, not associated with food, and no relieving factors. He tried [place], and he did not get better. He did not try any other medications, like Prilosec, omeprazole or Pepcid.   PAST MEDICAL HISTORY:  Significant for a history of perinephric abscess in 2012, and also history of pneumonia, admitted in hospital in 2012. Denies any hypertension or diabetes.   SOCIAL HISTORY: Tobacco abuse, 1/2 pack per day. No alcohol. No drugs.   FAMILY HISTORY:  Hypertension to mother and heart failure to maternal grandmother.   MEDICATIONS:  The patient is not on any medications.   ALLERGIES: No known allergies.   OCCUPATION:  He works at [employer].   REVIEW OF SYSTEMS:  CONSTITUTIONAL:  The patient denies any fever, chills or weight loss.   EYES:  No conjunctivitis.   EARS:  No hearing loss.  THROAT:  No difficulty swallowing.  GASTROINTESTINAL: Has abdominal pain and coffee-ground vomiting, ongoing since Saturday.  GENITOURINARY: No dysuria.  ENDOCRINE: No polyuria, no polydipsia.  SKIN: No rashes.   MUSCULOSKELETAL: No joint pains.  NEUROLOGIC: No TIAs. No history of seizures.  PSYCHIATRIC:  No anxiety or insomnia.   PHYSICAL EXAMINATION: VITAL SIGNS: Temperature 98.2, heart rate 62, blood pressure 136/80, sats 98% on room air.  GENERAL: Alert, awake, oriented. Answers questions appropriately.  HEAD:  Atraumatic, normocephalic.  EYES:  Pupils equally reacting to light. Extraocular movements intact.  ENT: No tympanic membrane congestion. No turbinate hypertrophy. No evidence of erythema.  NECK: Normal range of motion. No JVD. No carotid bruit.  CARDIOVASCULAR: S1 and S2 regular. No murmurs.  LUNGS: Clear to auscultation. No wheeze. No rales.  ABDOMEN:  Epigastric tenderness present. No rebound tenderness. Bowel sounds present. No organomegaly.  EXTREMITIES: No edema. No cyanosis. No clubbing.  NEUROLOGIC: Alert, awake, oriented. Cranial nerves II through XII intact. Power 5/5 in upper and lower extremities. Senses are intact.  DTRs 2+ bilaterally.  PSYCHIATRIC:  Mood and affect are within normal limits.   RADIOLOGIC AND LABORATORY DATA: WBC is 6.8, hemoglobin 15.8, hematocrit 46.2, platelets 236. Electrolytes: Sodium 141, potassium 3.9, chloride 108, bicarb 29, BUN 12, creatinine 1.04, glucose 91. Patient's CT of the abdomen showed no acute abdominal or pelvic pathology and normal appendix. No hydronephrosis. No acute inflammatory stranding. Lipase is 74.   ASSESSMENT AND PLAN: A 39 year old  male with abdominal pain and also episodes of coffee-ground vomiting for the last 3 to 4 days. The patient is going to be placed in observation status. I spoke with Dr. Mechele CollinElliott, who suggested that he is going to endoscopy tomorrow and  advised to start IV PPIs, Protonix 40 IV b.i.d., and he will be n.p.o. after midnight. The patient is hemodynamically stable. He will have an endoscopy tomorrow, EGD, and further plans depending on EEG results. Discussed the plan with the patient, and he will be overnight observation.   TIME SPENT: About 50 minutes.    ____________________________ Katha HammingSnehalatha Rosalba Totty, MD sk:dm D: 11/06/2012 19:08:00 ET T: 11/06/2012 19:50:46 ET JOB#: [id number]  cc: Katha HammingSnehalatha Nohealani Medinger, MD, <Dictator> Katha HammingSNEHALATHA Jaire Pinkham MD ELECTRONICALLY SIGNED 11/28/2012 7:53

## 2015-01-17 NOTE — Consult Note (Signed)
PATIENT NAME:  Micheal Sanchez, Micheal Sanchez MR#:  [id number] DATE OF BIRTH:  Feb 06, 1976  DATE OF CONSULTATION:  11/07/2012  REFERRING PHYSICIAN:   CONSULTING PHYSICIAN:  Scot Junobert T. Elliott, MD  HISTORY OF PRESENT ILLNESS: The patient is a 39 year old white male who presented to the ER with epigastric abdominal pain and history of coffee-ground vomitus. The symptoms have come and gone recently, came back again twice Saturday and once on Sunday. His wife mentions that the patient was drinking rum before this episode started. He did not tell the ER this. He vomited coffee-ground material.   Sometimes for the abdominal pain, he would take ibuprofen. He has not had a previous upper endoscopy before.   HABITS: Smokes 1/2 pack a day. Drinks alcohol occasionally. He has taken TUMS for the epigastric abdominal pain. The pain does not radiate into his back. He has not been on a PPI or H2 blocker.   PAST MEDICAL HISTORY: He had a perinephric abscess in 2012 with staph. He was admitted to the hospital and also developed pneumonia and he was in the hospital for a week.   FAMILY HISTORY: Hypertension and heart failure.  MEDICATIONS: No current medications.   ALLERGIES: No known drug allergies.   OCCUPATION: Works at [employer].   REVIEW OF SYSTEMS: No chest pains. No shortness of breath. No chills or fever. No dysuria.   PHYSICAL EXAMINATION: GENERAL: White male who was asleep when I walked into the room.  VITAL SIGNS: Temperature 98.2, pulse 62, blood pressure 136/80, 98% on room air.  HEENT: Sclerae anicteric. Conjunctivae negative. Tongue negative.  CHEST: Clear.  HEART: No murmurs or gallops.  ABDOMEN: No hepatosplenomegaly. No masses. No bruits. Minimal epigastric tenderness.   LABORATORY DATA: On admission white count 6.8, hemoglobin 15.8, platelets 236, sodium 141, potassium 3.9, chloride 108, bicarbonate 29, BUN 12, creatinine 1.04 and glucose 91.   The patient's CT of the abdomen showed no acute abdominal  or pelvic pathology.  Lipase 74.   ASSESSMENT: Probable ulcer or gastritis/duodenitis possibly made worse by drinking rum and taking ibuprofen.   PLAN: Is to do upper endoscopy today and depending on the findings either keep him in the hospital or let him go home on medication. I told him that alcohol, coffee, tea and ibuprofen were going to be very bad for any ulcers he might have. The patient has agreed to have the procedure. I discussed with him and his wife. ____________________________ Scot Junobert T. Elliott, MD rte:sb D: 11/07/2012 07:08:31 ET T: 11/07/2012 07:36:57 ET JOB#: [id number]  cc: Scot Junobert T. Elliott, MD, <Dictator> [facility] [place] Scot JunOBERT T ELLIOTT MD ELECTRONICALLY SIGNED 11/16/2012 7:57
